# Patient Record
Sex: Male | Born: 1965 | Race: White | Hispanic: No | Marital: Married | Smoking: Former smoker
Health system: Southern US, Community
[De-identification: ages and names within clinical notes are randomized; demographics above are authoritative.]

## PROBLEM LIST (undated history)

## (undated) DIAGNOSIS — I10 Essential (primary) hypertension: Secondary | ICD-10-CM

## (undated) DIAGNOSIS — E669 Obesity, unspecified: Secondary | ICD-10-CM

## (undated) DIAGNOSIS — E785 Hyperlipidemia, unspecified: Secondary | ICD-10-CM

## (undated) DIAGNOSIS — E1169 Type 2 diabetes mellitus with other specified complication: Secondary | ICD-10-CM

## (undated) DIAGNOSIS — G4700 Insomnia, unspecified: Secondary | ICD-10-CM

## (undated) DIAGNOSIS — E1159 Type 2 diabetes mellitus with other circulatory complications: Secondary | ICD-10-CM

## (undated) DIAGNOSIS — J302 Other seasonal allergic rhinitis: Secondary | ICD-10-CM

## (undated) DIAGNOSIS — N529 Male erectile dysfunction, unspecified: Secondary | ICD-10-CM

## (undated) DIAGNOSIS — E119 Type 2 diabetes mellitus without complications: Secondary | ICD-10-CM

## (undated) HISTORY — DX: Obesity, unspecified: E66.9

## (undated) HISTORY — DX: Male erectile dysfunction, unspecified: N52.9

## (undated) HISTORY — DX: Hyperlipidemia, unspecified: E78.5

## (undated) HISTORY — DX: Essential (primary) hypertension: I10

## (undated) HISTORY — DX: Type 2 diabetes mellitus without complications: E11.9

## (undated) HISTORY — PX: OTHER SURGICAL HISTORY: SHX169

## (undated) HISTORY — DX: Type 2 diabetes mellitus with other circulatory complications: E11.69

## (undated) HISTORY — DX: Type 2 diabetes mellitus with other circulatory complications: E11.59

---

## 2006-07-19 ENCOUNTER — Emergency Department (HOSPITAL_COMMUNITY): Admission: EM | Admit: 2006-07-19 | Discharge: 2006-07-20 | Payer: Self-pay | Admitting: Emergency Medicine

## 2013-08-06 ENCOUNTER — Emergency Department (HOSPITAL_COMMUNITY)
Admission: EM | Admit: 2013-08-06 | Discharge: 2013-08-06 | Disposition: A | Discharge status: Home / Self Care | Payer: BC Managed Care – PPO | Attending: Emergency Medicine | Admitting: Emergency Medicine

## 2013-08-06 ENCOUNTER — Encounter (HOSPITAL_COMMUNITY): Payer: Self-pay | Admitting: Emergency Medicine

## 2013-08-06 ENCOUNTER — Emergency Department (HOSPITAL_COMMUNITY): Payer: BC Managed Care – PPO

## 2013-08-06 DIAGNOSIS — M545 Low back pain, unspecified: Secondary | ICD-10-CM | POA: Insufficient documentation

## 2013-08-06 DIAGNOSIS — M549 Dorsalgia, unspecified: Secondary | ICD-10-CM

## 2013-08-06 DIAGNOSIS — R52 Pain, unspecified: Secondary | ICD-10-CM | POA: Insufficient documentation

## 2013-08-06 MED ORDER — IBUPROFEN 800 MG PO TABS: 800.0000 mg | ORAL_TABLET | Freq: Three times a day (TID) | ORAL | Status: DC

## 2013-08-06 MED ORDER — ONDANSETRON HCL 4 MG/2ML IJ SOLN
4.0000 mg | Freq: Once | INTRAMUSCULAR | Status: AC
  Administered 2013-08-06: 4 mg via INTRAMUSCULAR
  Filled 2013-08-06: qty 2

## 2013-08-06 MED ORDER — HYDROCODONE-ACETAMINOPHEN 5-325 MG PO TABS: 2.0000 | ORAL_TABLET | ORAL | Status: DC | PRN

## 2013-08-06 MED ORDER — KETOROLAC TROMETHAMINE 60 MG/2ML IM SOLN
60.0000 mg | Freq: Once | INTRAMUSCULAR | Status: AC
  Administered 2013-08-06: 60 mg via INTRAMUSCULAR
  Filled 2013-08-06: qty 2

## 2013-08-06 MED ORDER — HYDROMORPHONE HCL PF 2 MG/ML IJ SOLN
2.0000 mg | Freq: Once | INTRAMUSCULAR | Status: AC
  Administered 2013-08-06: 2 mg via INTRAMUSCULAR
  Filled 2013-08-06: qty 1

## 2013-08-06 NOTE — ED Provider Notes (Signed)
CSN: [id number]     Arrival date & time 08/06/13  9562 History     None    Chief Complaint  Patient presents with  . Back Pain   (Consider location/radiation/quality/duration/timing/severity/associated sxs/prior Treatment) Patient is a 47 y.o. male presenting with back pain. The history is provided by the patient. No language interpreter was used.  Back Pain Location:  Lumbar spine Quality:  Aching Pain severity:  Severe Pain is:  Worse during the day Onset quality:  Sudden Duration:  1 day Timing:  Constant Progression:  Worsening Chronicity:  New Relieved by:  Nothing Worsened by:  Nothing tried Pt was attempting to restrain son who is MR and violent.   Pt reports area of pain is low back and radiates around to side of abdomen  History reviewed. No pertinent past medical history. History reviewed. No pertinent past surgical history. History reviewed. No pertinent family history. History  Substance Use Topics  . Smoking status: Never Smoker   . Smokeless tobacco: Not on file  . Alcohol Use: No    Review of Systems  Musculoskeletal: Positive for back pain.  All other systems reviewed and are negative.    Allergies  Review of patient's allergies indicates no known allergies.  Home Medications   Current Outpatient Rx  Name  Route  Sig  Dispense  Refill  . cetirizine (ZYRTEC) 10 MG tablet   Oral   Take 10 mg by mouth daily as needed for allergies.         . Liniments (DEEP BLUE RELIEF EX)   Apply externally   Apply 1 application topically 2 (two) times daily as needed (Biofreeze).         . traZODone (DESYREL) 150 MG tablet   Oral   Take 150 mg by mouth at bedtime.         . cyclobenzaprine (FLEXERIL) 10 MG tablet   Oral   Take 10 mg by mouth once.         Marland Kitchen ibuprofen (ADVIL,MOTRIN) 800 MG tablet   Oral   Take 800 mg by mouth once.          BP 150/90  Pulse 98  Temp(Src) 97.9 F (36.6 C) (Oral)  Resp 20  SpO2 94% Physical Exam   Nursing note and vitals reviewed. Constitutional: He appears well-developed and well-nourished.  HENT:  Head: Normocephalic and atraumatic.  Eyes: Conjunctivae and EOM are normal. Pupils are equal, round, and reactive to light.  Neck: Normal range of motion. Neck supple.  Cardiovascular: Normal rate and regular rhythm.   Pulmonary/Chest: Effort normal and breath sounds normal.  Abdominal: Soft.  Musculoskeletal:  Tender right lumbar spine and lateral to Lumbar    ED Course   Procedures (including critical care time)  Labs Reviewed - No data to display No results found. 1. Back pain, acute     MDM  No results found for this or any previous visit. Dg Lumbar Spine Complete  08/06/2013   *RADIOLOGY REPORT*  Clinical Data: Back injury yesterday, mid to lower back pain particularly on the right side  LUMBAR SPINE - COMPLETE 4+ VIEW  Comparison: None.  Findings: Normal alignment.  No fracture.  Mild degenerative disc disease at L4-5 and L3-4.  Moderate L2-3 and L1-2 degenerative disc disease.  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  IMPRESSION: Degenerative changes.  No acute abnormalities.   Original Report Authenticated By: Esperanza Heir, M.D.  Pt given  medication for pain.  I advised ice or hear   Elson Areas, PA-C 08/06/13 1054  Lonia Skinner [place], [place] 08/06/13 1057

## 2013-08-06 NOTE — ED Notes (Signed)
Patient presents to ED via POV after wrestling with son last night. Patient reports right lower back pain around to abdomen.

## 2013-08-06 NOTE — ED Provider Notes (Signed)
Medical screening examination/treatment/procedure(s) were performed by non-physician practitioner and as supervising physician I was immediately available for consultation/collaboration.   Loren Racer, MD 08/06/13 1145

## 2015-03-08 ENCOUNTER — Emergency Department
Admission: EM | Admit: 2015-03-08 | Discharge: 2015-03-08 | Disposition: A | Discharge status: Home / Self Care | Payer: BLUE CROSS/BLUE SHIELD | Source: Home / Self Care | Attending: Emergency Medicine | Admitting: Emergency Medicine

## 2015-03-08 ENCOUNTER — Emergency Department (INDEPENDENT_AMBULATORY_CARE_PROVIDER_SITE_OTHER): Payer: BLUE CROSS/BLUE SHIELD

## 2015-03-08 ENCOUNTER — Encounter: Payer: Self-pay | Admitting: Emergency Medicine

## 2015-03-08 DIAGNOSIS — R05 Cough: Secondary | ICD-10-CM | POA: Diagnosis not present

## 2015-03-08 DIAGNOSIS — R059 Cough, unspecified: Secondary | ICD-10-CM

## 2015-03-08 HISTORY — DX: Other seasonal allergic rhinitis: J30.2

## 2015-03-08 HISTORY — DX: Insomnia, unspecified: G47.00

## 2015-03-08 MED ORDER — IPRATROPIUM-ALBUTEROL 0.5-2.5 (3) MG/3ML IN SOLN: 3.0000 mL | Freq: Once | RESPIRATORY_TRACT | Status: DC

## 2015-03-08 MED ORDER — ALBUTEROL SULFATE HFA 108 (90 BASE) MCG/ACT IN AERS: 1.0000 | INHALATION_SPRAY | Freq: Four times a day (QID) | RESPIRATORY_TRACT | Status: DC | PRN

## 2015-03-08 MED ORDER — PREDNISONE 10 MG PO TABS: ORAL_TABLET | ORAL | Status: DC

## 2015-03-08 NOTE — ED Provider Notes (Signed)
CSN: [id number]     Arrival date & time 03/08/15  1614 History   First MD Initiated Contact with Patient 03/08/15 1704     Chief Complaint  Patient presents with  . Nasal Congestion   (Consider location/radiation/quality/duration/timing/severity/associated sxs/prior Treatment) Patient is a 49 y.o. male presenting with cough. The history is provided by the patient. No language interpreter was used.  Cough Cough characteristics:  Non-productive Severity:  Moderate Onset quality:  Gradual Duration:  4 days Timing:  Constant Progression:  Worsening Chronicity:  New Smoker: no   Context: upper respiratory infection   Relieved by:  Nothing Worsened by:  Deep breathing Ineffective treatments:  None tried Associated symptoms: fever   Pt complains of difficulty breathing  Past Medical History  Diagnosis Date  . Seasonal allergies   . Insomnia    History reviewed. No pertinent past surgical history. History reviewed. No pertinent family history. History  Substance Use Topics  . Smoking status: Never Smoker   . Smokeless tobacco: Not on file  . Alcohol Use: No    Review of Systems  Constitutional: Positive for fever.  Respiratory: Positive for cough.   All other systems reviewed and are negative.   Allergies  Review of patient's allergies indicates no known allergies.  Home Medications   Prior to Admission medications   Medication Sig Start Date End Date Taking? Authorizing Provider  cetirizine (ZYRTEC) 10 MG tablet Take 10 mg by mouth daily as needed for allergies.    Historical Provider, MD  cyclobenzaprine (FLEXERIL) 10 MG tablet Take 10 mg by mouth once.    Historical Provider, MD  HYDROcodone-acetaminophen (NORCO/VICODIN) 5-325 MG per tablet Take 2 tablets by mouth every 4 (four) hours as needed for pain. 08/06/13   Elson AreasLeslie K Kirah Stice, PA-C  ibuprofen (ADVIL,MOTRIN) 800 MG tablet Take 800 mg by mouth once.    Historical Provider, MD  ibuprofen (ADVIL,MOTRIN) 800 MG tablet  Take 1 tablet (800 mg total) by mouth 3 (three) times daily. 08/06/13   Elson AreasLeslie K Adelei Scobey, PA-C  Liniments (DEEP BLUE RELIEF EX) Apply 1 application topically 2 (two) times daily as needed (Biofreeze).    Historical Provider, MD  traZODone (DESYREL) 150 MG tablet Take 150 mg by mouth at bedtime.    Historical Provider, MD   BP 148/82 mmHg  Pulse 100  Temp(Src) 99.2 F (37.3 C) (Oral)  Resp 16  Ht 5\' 11"  (1.803 m)  Wt 270 lb (122.471 kg)  BMI 37.67 kg/m2  SpO2 96% Physical Exam  Constitutional: He is oriented to person, place, and time. He appears well-developed and well-nourished.  HENT:  Head: Normocephalic.  Eyes: Conjunctivae and EOM are normal. Pupils are equal, round, and reactive to light.  Neck: Normal range of motion.  Cardiovascular: Normal rate and normal heart sounds.   Pulmonary/Chest: He has wheezes.  Rhonchi,  Faint wheezing  Abdominal: Soft. He exhibits no distension.  Musculoskeletal: Normal range of motion.  Neurological: He is alert and oriented to person, place, and time.  Skin: Skin is warm.  Psychiatric: He has a normal mood and affect.  Nursing note and vitals reviewed.   ED Course  Procedures (including critical care time) Labs Review Labs Reviewed - No data to display  Imaging Review Dg Chest 2 View  03/08/2015   CLINICAL DATA:  Several week history of cough. Four day history of nasal congestion  EXAM: CHEST  2 VIEW  COMPARISON:  None.  FINDINGS: There is no edema or consolidation. The heart size and  pulmonary vascularity are normal. No adenopathy. No bone lesions.  IMPRESSION: No edema or consolidation.   Electronically Signed   By: Bretta Bang III M.D.   On: 03/08/2015 17:25     MDM  Pt given duoneb and feels much better.   Chest xray no pneumonia.   Pt given rx for prednisone and albuterol.    1. Cough    AVS    Elson Areas, PA-C 03/08/15 1750

## 2015-03-08 NOTE — ED Notes (Signed)
Reports 4 day history of congestion; thought it might be seasonal allergies, but his Zyrtec has not been effective.

## 2015-03-08 NOTE — Discharge Instructions (Signed)
Fever, Adult °A fever is a higher than normal body temperature. In an adult, an oral temperature around 98.6° F (37° C) is considered normal. A temperature of 100.4° F (38° C) or higher is generally considered a fever. Mild or moderate fevers generally have no long-term effects and often do not require treatment. Extreme fever (greater than or equal to 106° F or 41.1° C) can cause seizures. The sweating that may occur with repeated or prolonged fever may cause dehydration. Elderly people can develop confusion during a fever. °A measured temperature can vary with: °· Age. °· Time of day. °· Method of measurement (mouth, underarm, rectal, or ear). °The fever is confirmed by taking a temperature with a thermometer. Temperatures can be taken different ways. Some methods are accurate and some are not. °· An oral temperature is used most commonly. Electronic thermometers are fast and accurate. °· An ear temperature will only be accurate if the thermometer is positioned as recommended by the manufacturer. °· A rectal temperature is accurate and done for those adults who have a condition where an oral temperature cannot be taken. °· An underarm (axillary) temperature is not accurate and not recommended. °Fever is a symptom, not a disease.  °CAUSES  °· Infections commonly cause fever. °· Some noninfectious causes for fever include: °¨ Some arthritis conditions. °¨ Some thyroid or adrenal gland conditions. °¨ Some immune system conditions. °¨ Some types of cancer. °¨ A medicine reaction. °¨ High doses of certain street drugs such as methamphetamine. °¨ Dehydration. °¨ Exposure to high outside or room temperatures. °· Occasionally, the source of a fever cannot be determined. This is sometimes called a "fever of unknown origin" (FUO). °· Some situations may lead to a temporary rise in body temperature that may go away on its own. Examples are: °¨ Childbirth. °¨ Surgery. °¨ Intense exercise. °HOME CARE INSTRUCTIONS  °· Take  appropriate medicines for fever. Follow dosing instructions carefully. If you use acetaminophen to reduce the fever, be careful to avoid taking other medicines that also contain acetaminophen. Do not take aspirin for a fever if you are younger than age 19. There is an association with Reye's syndrome. Reye's syndrome is a rare but potentially deadly disease. °· If an infection is present and antibiotics have been prescribed, take them as directed. Finish them even if you start to feel better. °· Rest as needed. °· Maintain an adequate fluid intake. To prevent dehydration during an illness with prolonged or recurrent fever, you may need to drink extra fluid. Drink enough fluids to keep your urine clear or pale yellow. °· Sponging or bathing with room temperature water may help reduce body temperature. Do not use ice water or alcohol sponge baths. °· Dress comfortably, but do not over-bundle. °SEEK MEDICAL CARE IF:  °· You are unable to keep fluids down. °· You develop vomiting or diarrhea. °· You are not feeling at least partly better after 3 days. °· You develop new symptoms or problems. °SEEK IMMEDIATE MEDICAL CARE IF:  °· You have shortness of breath or trouble breathing. °· You develop excessive weakness. °· You are dizzy or you faint. °· You are extremely thirsty or you are making little or no urine. °· You develop new pain that was not there before (such as in the head, neck, chest, back, or abdomen). °· You have persistent vomiting and diarrhea for more than 1 to 2 days. °· You develop a stiff neck or your eyes become sensitive to light. °· You develop a   skin rash. °· You have a fever or persistent symptoms for more than 2 to 3 days. °· You have a fever and your symptoms suddenly get worse. °MAKE SURE YOU:  °· Understand these instructions. °· Will watch your condition. °· Will get help right away if you are not doing well or get worse. °Document Released: 06/02/2001 Document Revised: 04/23/2014 Document  Reviewed: 10/08/2011 °[employer]® Patient Information ©2015 [employer]. This information is not intended to replace advice given to you by your health care provider. Make sure you discuss any questions you have with your health care provider. °Acute Bronchitis °Bronchitis is inflammation of the airways that extend from the windpipe into the lungs (bronchi). The inflammation often causes mucus to develop. This leads to a cough, which is the most common symptom of bronchitis.  °In acute bronchitis, the condition usually develops suddenly and goes away over time, usually in a couple weeks. Smoking, allergies, and asthma can make bronchitis worse. Repeated episodes of bronchitis may cause further lung problems.  °CAUSES °Acute bronchitis is most often caused by the same virus that causes a cold. The virus can spread from person to person (contagious) through coughing, sneezing, and touching contaminated objects. °SIGNS AND SYMPTOMS  °· Cough.   °· Fever.   °· Coughing up mucus.   °· Body aches.   °· Chest congestion.   °· Chills.   °· Shortness of breath.   °· Sore throat.   °DIAGNOSIS  °Acute bronchitis is usually diagnosed through a physical exam. Your health care provider will also ask you questions about your medical history. Tests, such as chest X-rays, are sometimes done to rule out other conditions.  °TREATMENT  °Acute bronchitis usually goes away in a couple weeks. Oftentimes, no medical treatment is necessary. Medicines are sometimes given for relief of fever or cough. Antibiotic medicines are usually not needed but may be prescribed in certain situations. In some cases, an inhaler may be recommended to help reduce shortness of breath and control the cough. A cool mist vaporizer may also be used to help thin bronchial secretions and make it easier to clear the chest.  °HOME CARE INSTRUCTIONS °· Get plenty of rest.   °· Drink enough fluids to keep your urine clear or pale yellow (unless you have a medical  condition that requires fluid restriction). Increasing fluids may help thin your respiratory secretions (sputum) and reduce chest congestion, and it will prevent dehydration.   °· Take medicines only as directed by your health care provider. °· If you were prescribed an antibiotic medicine, finish it all even if you start to feel better. °· Avoid smoking and secondhand smoke. Exposure to cigarette smoke or irritating chemicals will make bronchitis worse. If you are a smoker, consider using nicotine gum or skin patches to help control withdrawal symptoms. Quitting smoking will help your lungs heal faster.   °· Reduce the chances of another bout of acute bronchitis by washing your hands frequently, avoiding people with cold symptoms, and trying not to touch your hands to your mouth, nose, or eyes.   °· Keep all follow-up visits as directed by your health care provider.   °SEEK MEDICAL CARE IF: °Your symptoms do not improve after 1 week of treatment.  °SEEK IMMEDIATE MEDICAL CARE IF: °· You develop an increased fever or chills.   °· You have chest pain.   °· You have severe shortness of breath. °· You have bloody sputum.   °· You develop dehydration. °· You faint or repeatedly feel like you are going to pass out. °· You develop repeated vomiting. °·   You develop a severe headache. °MAKE SURE YOU:  °· Understand these instructions. °· Will watch your condition. °· Will get help right away if you are not doing well or get worse. °Document Released: 01/14/2005 Document Revised: 04/23/2014 Document Reviewed: 05/30/2013 °[employer]® Patient Information ©2015 [employer]. This information is not intended to replace advice given to you by your health care provider. Make sure you discuss any questions you have with your health care provider. ° °

## 2016-06-25 DIAGNOSIS — E119 Type 2 diabetes mellitus without complications: Secondary | ICD-10-CM | POA: Diagnosis not present

## 2016-09-01 ENCOUNTER — Emergency Department (HOSPITAL_BASED_OUTPATIENT_CLINIC_OR_DEPARTMENT_OTHER): Payer: BLUE CROSS/BLUE SHIELD

## 2016-09-01 ENCOUNTER — Emergency Department (HOSPITAL_BASED_OUTPATIENT_CLINIC_OR_DEPARTMENT_OTHER)
Admission: EM | Admit: 2016-09-01 | Discharge: 2016-09-02 | Disposition: A | Discharge status: Home / Self Care | Payer: BLUE CROSS/BLUE SHIELD | Attending: Emergency Medicine | Admitting: Emergency Medicine

## 2016-09-01 ENCOUNTER — Encounter (HOSPITAL_BASED_OUTPATIENT_CLINIC_OR_DEPARTMENT_OTHER): Payer: Self-pay | Admitting: Emergency Medicine

## 2016-09-01 DIAGNOSIS — Y939 Activity, unspecified: Secondary | ICD-10-CM | POA: Diagnosis not present

## 2016-09-01 DIAGNOSIS — S20211A Contusion of right front wall of thorax, initial encounter: Secondary | ICD-10-CM | POA: Diagnosis not present

## 2016-09-01 DIAGNOSIS — S39012A Strain of muscle, fascia and tendon of lower back, initial encounter: Secondary | ICD-10-CM

## 2016-09-01 DIAGNOSIS — S161XXA Strain of muscle, fascia and tendon at neck level, initial encounter: Secondary | ICD-10-CM | POA: Diagnosis not present

## 2016-09-01 DIAGNOSIS — M542 Cervicalgia: Secondary | ICD-10-CM | POA: Diagnosis not present

## 2016-09-01 DIAGNOSIS — Y92410 Unspecified street and highway as the place of occurrence of the external cause: Secondary | ICD-10-CM | POA: Diagnosis not present

## 2016-09-01 DIAGNOSIS — S3992XA Unspecified injury of lower back, initial encounter: Secondary | ICD-10-CM | POA: Diagnosis not present

## 2016-09-01 DIAGNOSIS — Y999 Unspecified external cause status: Secondary | ICD-10-CM | POA: Diagnosis not present

## 2016-09-01 DIAGNOSIS — M545 Low back pain: Secondary | ICD-10-CM | POA: Diagnosis not present

## 2016-09-01 DIAGNOSIS — M546 Pain in thoracic spine: Secondary | ICD-10-CM

## 2016-09-01 DIAGNOSIS — S199XXA Unspecified injury of neck, initial encounter: Secondary | ICD-10-CM | POA: Diagnosis not present

## 2016-09-01 DIAGNOSIS — S299XXA Unspecified injury of thorax, initial encounter: Secondary | ICD-10-CM | POA: Diagnosis not present

## 2016-09-01 MED ORDER — MELOXICAM 15 MG PO TABS: 15.0000 mg | ORAL_TABLET | Freq: Every day | ORAL | 0 refills | Status: DC

## 2016-09-01 MED ORDER — METHOCARBAMOL 500 MG PO TABS
750.0000 mg | ORAL_TABLET | Freq: Once | ORAL | Status: AC
  Administered 2016-09-02: 750 mg via ORAL
  Filled 2016-09-01: qty 2

## 2016-09-01 MED ORDER — METHOCARBAMOL 750 MG PO TABS: 750.0000 mg | ORAL_TABLET | Freq: Two times a day (BID) | ORAL | 0 refills | Status: DC

## 2016-09-01 MED ORDER — KETOROLAC TROMETHAMINE 60 MG/2ML IM SOLN
60.0000 mg | Freq: Once | INTRAMUSCULAR | Status: AC
  Administered 2016-09-02: 60 mg via INTRAMUSCULAR
  Filled 2016-09-01: qty 2
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
## 2016-09-01 NOTE — ED Notes (Signed)
PA at bedside.

## 2016-09-01 NOTE — ED Notes (Signed)
Patient transported to X-ray 

## 2016-09-01 NOTE — Discharge Instructions (Signed)
Take Mobic as prescribed for pain and inflammation. Robaxin for muscle spasms. Rest. Follow with primary care doctor if not improving. Return if worsening.
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
## 2016-09-01 NOTE — ED Notes (Signed)
MD at bedside to do bedside US.

## 2016-09-02 DIAGNOSIS — S161XXA Strain of muscle, fascia and tendon at neck level, initial encounter: Secondary | ICD-10-CM | POA: Diagnosis not present

## 2016-10-09 DIAGNOSIS — Z23 Encounter for immunization: Secondary | ICD-10-CM | POA: Diagnosis not present

## 2017-01-05 DIAGNOSIS — E119 Type 2 diabetes mellitus without complications: Secondary | ICD-10-CM | POA: Diagnosis not present

## 2017-02-01 DIAGNOSIS — R070 Pain in throat: Secondary | ICD-10-CM | POA: Diagnosis not present

## 2017-02-01 DIAGNOSIS — E119 Type 2 diabetes mellitus without complications: Secondary | ICD-10-CM | POA: Diagnosis not present

## 2017-08-30 DIAGNOSIS — J0110 Acute frontal sinusitis, unspecified: Secondary | ICD-10-CM | POA: Diagnosis not present

## 2017-08-30 DIAGNOSIS — G4700 Insomnia, unspecified: Secondary | ICD-10-CM | POA: Diagnosis not present

## 2017-08-30 DIAGNOSIS — Z8249 Family history of ischemic heart disease and other diseases of the circulatory system: Secondary | ICD-10-CM | POA: Diagnosis not present

## 2017-09-29 DIAGNOSIS — E669 Obesity, unspecified: Secondary | ICD-10-CM | POA: Diagnosis not present

## 2017-09-29 DIAGNOSIS — Z0000 Encounter for general adult medical examination without abnormal findings: Secondary | ICD-10-CM | POA: Diagnosis not present

## 2017-09-29 DIAGNOSIS — Z23 Encounter for immunization: Secondary | ICD-10-CM | POA: Diagnosis not present

## 2017-09-29 DIAGNOSIS — E7800 Pure hypercholesterolemia, unspecified: Secondary | ICD-10-CM | POA: Diagnosis not present

## 2017-09-29 DIAGNOSIS — Z125 Encounter for screening for malignant neoplasm of prostate: Secondary | ICD-10-CM | POA: Diagnosis not present

## 2017-09-29 DIAGNOSIS — E119 Type 2 diabetes mellitus without complications: Secondary | ICD-10-CM | POA: Diagnosis not present

## 2018-08-01 DIAGNOSIS — I10 Essential (primary) hypertension: Secondary | ICD-10-CM | POA: Diagnosis not present

## 2018-08-01 DIAGNOSIS — E119 Type 2 diabetes mellitus without complications: Secondary | ICD-10-CM | POA: Diagnosis not present

## 2018-08-01 DIAGNOSIS — R0609 Other forms of dyspnea: Secondary | ICD-10-CM | POA: Diagnosis not present

## 2018-08-01 DIAGNOSIS — F17290 Nicotine dependence, other tobacco product, uncomplicated: Secondary | ICD-10-CM | POA: Diagnosis not present
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
## 2018-10-10 DIAGNOSIS — E1169 Type 2 diabetes mellitus with other specified complication: Secondary | ICD-10-CM | POA: Diagnosis not present

## 2018-10-10 DIAGNOSIS — N528 Other male erectile dysfunction: Secondary | ICD-10-CM | POA: Diagnosis not present

## 2018-10-10 DIAGNOSIS — G4709 Other insomnia: Secondary | ICD-10-CM | POA: Diagnosis not present

## 2018-10-10 DIAGNOSIS — I10 Essential (primary) hypertension: Secondary | ICD-10-CM | POA: Diagnosis not present

## 2018-10-10 DIAGNOSIS — Z23 Encounter for immunization: Secondary | ICD-10-CM | POA: Diagnosis not present

## 2018-10-14 DIAGNOSIS — E785 Hyperlipidemia, unspecified: Secondary | ICD-10-CM | POA: Diagnosis not present

## 2018-10-14 DIAGNOSIS — E119 Type 2 diabetes mellitus without complications: Secondary | ICD-10-CM | POA: Diagnosis not present

## 2018-10-14 DIAGNOSIS — N529 Male erectile dysfunction, unspecified: Secondary | ICD-10-CM | POA: Diagnosis not present

## 2018-10-14 DIAGNOSIS — I10 Essential (primary) hypertension: Secondary | ICD-10-CM | POA: Diagnosis not present

## 2019-01-19 DIAGNOSIS — E1169 Type 2 diabetes mellitus with other specified complication: Secondary | ICD-10-CM | POA: Diagnosis not present

## 2019-01-19 DIAGNOSIS — Z125 Encounter for screening for malignant neoplasm of prostate: Secondary | ICD-10-CM | POA: Diagnosis not present

## 2019-01-19 DIAGNOSIS — R82998 Other abnormal findings in urine: Secondary | ICD-10-CM | POA: Diagnosis not present

## 2019-01-19 DIAGNOSIS — I10 Essential (primary) hypertension: Secondary | ICD-10-CM | POA: Diagnosis not present

## 2019-01-19 DIAGNOSIS — Z0000 Encounter for general adult medical examination without abnormal findings: Secondary | ICD-10-CM | POA: Diagnosis not present

## 2019-01-25 DIAGNOSIS — E1169 Type 2 diabetes mellitus with other specified complication: Secondary | ICD-10-CM | POA: Diagnosis not present

## 2019-01-25 DIAGNOSIS — G4709 Other insomnia: Secondary | ICD-10-CM | POA: Diagnosis not present

## 2019-01-25 DIAGNOSIS — I10 Essential (primary) hypertension: Secondary | ICD-10-CM | POA: Diagnosis not present

## 2019-01-25 DIAGNOSIS — E7849 Other hyperlipidemia: Secondary | ICD-10-CM | POA: Diagnosis not present

## 2019-01-25 DIAGNOSIS — Z0000 Encounter for general adult medical examination without abnormal findings: Secondary | ICD-10-CM | POA: Diagnosis not present

## 2019-02-13 ENCOUNTER — Ambulatory Visit (INDEPENDENT_AMBULATORY_CARE_PROVIDER_SITE_OTHER): Payer: BLUE CROSS/BLUE SHIELD | Admitting: Cardiology

## 2019-02-13 ENCOUNTER — Encounter: Payer: Self-pay | Admitting: Cardiology

## 2019-02-13 VITALS — BP 154/102 | HR 74 | Ht 71.0 in | Wt 269.0 lb

## 2019-02-13 DIAGNOSIS — E8881 Metabolic syndrome: Secondary | ICD-10-CM

## 2019-02-13 DIAGNOSIS — Z8249 Family history of ischemic heart disease and other diseases of the circulatory system: Secondary | ICD-10-CM

## 2019-02-13 DIAGNOSIS — I10 Essential (primary) hypertension: Secondary | ICD-10-CM

## 2019-02-13 DIAGNOSIS — Z01818 Encounter for other preprocedural examination: Secondary | ICD-10-CM

## 2019-02-13 DIAGNOSIS — E119 Type 2 diabetes mellitus without complications: Secondary | ICD-10-CM

## 2019-02-13 DIAGNOSIS — E785 Hyperlipidemia, unspecified: Secondary | ICD-10-CM

## 2019-02-13 DIAGNOSIS — R0609 Other forms of dyspnea: Secondary | ICD-10-CM | POA: Diagnosis not present

## 2019-02-13 DIAGNOSIS — E1169 Type 2 diabetes mellitus with other specified complication: Secondary | ICD-10-CM | POA: Insufficient documentation

## 2019-02-13 DIAGNOSIS — R0600 Dyspnea, unspecified: Secondary | ICD-10-CM

## 2019-02-13 MED ORDER — METOPROLOL TARTRATE 50 MG PO TABS: 100.0000 mg | ORAL_TABLET | Freq: Once | ORAL | 0 refills | Status: AC

## 2019-02-13 NOTE — Patient Instructions (Addendum)
Medication Instructions:  See instructions   CCTA  If you need a refill on your cardiac medications before your next appointment, please call your pharmacy.   Lab work: SEE INSTRUCTION CCTA If you have labs (blood work) drawn today and your tests are completely normal, you will receive your results only by: Marland Kitchen MyChart Message (if you have MyChart) OR . A paper copy in the mail If you have any lab test that is abnormal or we need to change your treatment, we will call you to review the results.  Testing/Procedures: WILL BE SCHEDULE AFTER  AUTHORIZATION IS OBTAINED FROM YOUR INSURANCE [facility] RADIOLOGY DEPARTMENT Your physician has requested that you have Coronary cardiac CTA. Cardiac computed tomography (CT) is a painless test that uses an x-ray machine to take clear, detailed pictures of your heart. For further information please visit [email or web address] Please follow instruction sheet as given.     Follow-Up: At [facility], you and your health needs are our priority.  As part of our continuing mission to provide you with exceptional heart care, we have created designated Provider Care Teams.  These Care Teams include your primary Cardiologist (physician) and Advanced Practice Providers (APPs -  Physician Assistants and Nurse Practitioners) who all work together to provide you with the care you need, when you need it. . Your physician recommends that you schedule a follow-up appointment in 2 MONTHS WITH DR HARDING .   Any Other Special Instructions Will Be Listed Below (If Applicable).   Please arrive at the [facility] main entrance of [facility].(30-45 minutes prior to test start time)  [facility] [address] [place], [place] [address] [phone]  Proceed to the [facility] Radiology Department (First Floor).  Please follow these instructions carefully (unless otherwise directed):  PLEASE HAVE LAB WORK DONE ONE WEEK PRIOR TO TEST.  On  the Night Before the Test: . Be sure to Drink plenty of water. . Do not consume any caffeinated/decaffeinated beverages or chocolate 12 hours prior to your test. . Do not take any antihistamines 12 hours prior to your test. . If you take Metformin do not take 24 hours prior to test.  On the Day of the Test: . Drink plenty of water. Do not drink any water within one hour of the test. . Do not eat any food 4 hours prior to the test. . You may take your regular medications prior to the test.  . Take metoprolol (Lopressor)  100 MG  two hours prior to test.   After the Test: . Drink plenty of water. . After receiving IV contrast, you may experience a mild flushed feeling. This is normal. . On occasion, you may experience a mild rash up to 24 hours after the test. This is not dangerous. If this occurs, you can take Benadryl 25 mg and increase your fluid intake. . If you experience trouble breathing, this can be serious. If it is severe call 911 IMMEDIATELY. If it is mild, please call our office. . If you take any of these medications: Glipizide/Metformin, Avandament, Glucavance, please do not take 48 hours after completing test.

## 2019-02-13 NOTE — Progress Notes (Signed)
PCP: Alysia PennaHolwerda, Scott, MD  Clinic Note: Chief Complaint  Patient presents with  . New Patient (Initial Visit)    Baseline cardiac evaluation: Strong family history of premature CAD, hypertension, hyperlipidemia, DM-2, former smoker (metabolic syndrome)    HPI: Brian Odonnell is a 53 y.o. male with a PMH documented below who presents today for baseline cardiovascular evaluation at the request of Alysia PennaHolwerda, Scott, MD.  Brian Odonnell was last seen on February 6 by Dr. Link SnufferHolwerda.  Recent Hospitalizations: None  Studies Personally Reviewed - (if available, images/films reviewed: From Epic Chart or Care Everywhere)  None  Interval History: Brian Odonnell is a very pleasant gentleman with a significant family history of CAD who presents here for baseline evaluation.  He is a little bit concerned because he is starting to be much more active with walking exercises accompanying his wife during work breaks.  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  He has noted occasionally feeling short of breath doing activities that he did not easily make him feel short of breath.  He has not had any chest pain or pressure with rest or exertion.  He denies any resting dyspnea.  No PND, orthopnea.  No significant edema just may be some mild swelling in his feet after being on his feet all day long. He denies any rapid regular heartbeats palpitations.  No syncope/near syncope or TIA/amaurosis fugax. No claudication.  PAD Screen 02/13/2019  Previous PAD dx? No  Previous surgical procedure? No  Pain with walking? No  Feet/toe relief with dangling? No  Painful, non-healing ulcers? No  Extremities discolored? No    A comprehensive was performed. Review of Systems  Constitutional: Negative for malaise/fatigue and weight loss.  HENT: Negative for congestion and nosebleeds.   Respiratory: Positive for shortness of breath (Per HPI). Negative for  cough and wheezing.   Gastrointestinal: Negative for blood in stool, heartburn and melena.  Genitourinary: Negative for hematuria.  Musculoskeletal: Negative for joint pain.  Neurological: Negative for dizziness, focal weakness and weakness.  Psychiatric/Behavioral: Negative for memory loss. The patient is not nervous/anxious and does not have insomnia.   All other systems reviewed and are negative.   I have reviewed and (if needed) personally updated the patient's problem list, medications, allergies, past medical and surgical history, social and family history.   Past Medical History:  Diagnosis Date  . Diabetes mellitus without complication (HCC)    Currently on metformin  . ED (erectile dysfunction)   . Essential hypertension    On losartan  . Hyperlipidemia    Not on statin  . Insomnia   . Obesity, diabetes, and hypertension syndrome (HCC)    BMI of 37 with other risk factors = morbid obesity  . Seasonal allergies     Past Surgical History:  Procedure Laterality Date  . None      Current Meds  Medication Sig  . aspirin EC 81 MG tablet Take 81 mg by mouth daily.  Marland Kitchen. losartan (COZAAR) 100 MG tablet Take 100 mg by mouth daily.  . metformin (FORTAMET) 500 MG (OSM) 24 hr tablet Take 1,000 mg by mouth 2 (two) times daily with a meal.  . traZODone (DESYREL) 150 MG tablet Take 150 mg by mouth at bedtime.    No Known Allergies  Social History   Tobacco Use  . Smoking status: Former Smoker    Packs/day: 1.50  Years: 13.00    Pack years: 19.50    Types: Cigarettes    Last attempt to quit: 02/07/1996    Years since quitting: 23.0  . Smokeless tobacco: Never Used  Substance Use Topics  . Alcohol use: Yes    Alcohol/week: 3.0 standard drinks    Types: 3 Standard drinks or equivalent per week    Comment: socially -usually vodka or tequila  . Drug use: Never   Social History   Social History Narrative   He is a former smoker of 1 to 2 packs a day, smoked from  [phone].   He is a Production designer, theatre/television/film of a TEFL teacher.  Does have passive smoke exposure.   Less than 1 caffeine drink a day.   Walks apically 7 days a week (roughly 30 minutes) during summer months, weather dependent during colder months, but walks outside with his wife during lunch.   3 vodka or tequila drinks a week.      As of 2019, children's age 30, 59, 47 and 68.  54 year old is severely autistic    family history includes Arthritis in his maternal grandmother; Heart attack in his paternal grandfather and paternal uncle; Heart attack (age of onset: 29) in his father; Hypertension in his father; Obesity in his father.  Wt Readings from Last 3 Encounters:  02/13/19 269 lb (122 kg)  09/01/16 255 lb (115.7 kg)  03/08/15 270 lb (122.5 kg)    PHYSICAL EXAM BP (!) 154/102   Pulse 74   Ht 5\' 11"  (1.803 m)   Wt 269 lb (122 kg)   BMI 37.52 kg/m  Physical Exam  Constitutional: He is oriented to person, place, and time. He appears well-developed and well-nourished. No distress.  Moderate to morbidly obese with BMI of 37. This may not be a true accurate assessment of obesity because he is quite muscular.  HENT:  Head: Normocephalic and atraumatic.  Eyes: Pupils are equal, round, and reactive to light. Conjunctivae and EOM are normal. No scleral icterus.  Neck: Normal range of motion. Neck supple. No hepatojugular reflux and no JVD present. Carotid bruit is not present. No tracheal deviation present. No thyromegaly present.  Cardiovascular: Normal rate, regular rhythm, S1 normal, S2 normal and normal pulses.  No extrasystoles are present. PMI is not displaced. Exam reveals distant heart sounds. Exam reveals no gallop and no friction rub.  No murmur heard. Pulmonary/Chest: Effort normal and breath sounds normal. No respiratory distress. He has no wheezes. He has no rales. He exhibits no tenderness.  Abdominal: Soft. Bowel sounds are normal. He exhibits no distension. There is no abdominal  tenderness.  Unable to palpate HSM  Musculoskeletal: Normal range of motion.        General: No deformity or edema.  Lymphadenopathy:    He has no cervical adenopathy.  Neurological: He is alert and oriented to person, place, and time. No cranial nerve deficit.  Psychiatric: He has a normal mood and affect. His behavior is normal. Judgment and thought content normal.  Vitals reviewed.    Adult ECG Report  Rate: 74;  Rhythm: normal sinus rhythm and Normal axis, intervals and durations;   Narrative Interpretation: Normal EKG  Other studies Reviewed: Additional studies/ records that were reviewed today include:  Recent Labs: January 19, 2018  Na+ 139, K+ 4.2, Cl- 105, HCO3-26, BUN 12, Cr 0.9, Glu 200**, Ca2+ 9.2; AST 20, ALT 43, AlkP 61,  T Bili 1.6, TP 6.6,Alb 4.2  CBC: W 5.71, H/H 15.4/47.2, Plt  197  TC 198, TG 119, HDL 43, LDL 131; A1c 8.4; TSH 1.01   ASSESSMENT / PLAN: Problem List Items Addressed This Visit    DOE (dyspnea on exertion)    He is try to do a lot of exercise, but 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  This is little concerning for him since he is currently at the age that his father was when he first started having issues with his heart.  With multiple risk factors and significant family history, I do think that is reasonable to do a baseline evaluation. In the absence of symptoms I think we just need to do a coronary calcium score, however with exertional dyspnea as a possible anginal equivalent (in a diabetic you may not feel classic anginal symptoms), I feel it is warranted to evaluate for true ischemic CAD with anatomical evaluation.  Plan: Coronary Calcium Score with Coronary CT angiogram and possible CT FFR.      Relevant Orders   EKG 12-Lead   Basic metabolic panel   CT CORONARY MORPH W/CTA COR W/SCORE W/CA W/CM &/OR WO/CM   CT CORONARY FRACTIONAL FLOW RESERVE DATA PREP   CT CORONARY  FRACTIONAL FLOW RESERVE FLUID ANALYSIS   Essential hypertension (Chronic)    Blood pressure is high today, he is quite stressed out [place] [facility] with lots of stress about running late.  In his PCPs office, his blood pressure is well controlled.  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  We will simply monitor and follow-up. He is on high-dose losartan (appropriate for him being a diabetic).  For now would simply monitor.      Family history of premature CAD - Primary (Chronic)    His father had premature CAD in his early 8s (roughly his current age) and he did not have diabetes nor the smoking history.  He was also not as heavy. He is concerned necessarily because of his exertional dyspnea and the desire to get into an exercise program to try to lose weight and help his diabetes, hyperlipidemia and hypertension.  Plan: Baseline CARDIOVASCULAR EVALUATION WITH CORONARY CALCIUM SCORE PLUS CORONARY CT ANGIOGRAM in order to identify anatomic and physiologic evidence of CAD.      Relevant Orders   EKG 12-Lead   Basic metabolic panel   CT CORONARY MORPH W/CTA COR W/SCORE W/CA W/CM &/OR WO/CM   CT CORONARY FRACTIONAL FLOW RESERVE DATA PREP   CT CORONARY FRACTIONAL FLOW RESERVE FLUID ANALYSIS   Hyperlipidemia associated with type 2 diabetes mellitus (HCC) (Chronic)    Not currently on statin, but most recent labs showed total cholesterol of 198 and LDL of 131.  With his family history we want at least shoot for an LDL less than 100.  Depending on what our baseline cardiac evaluation shows, we can allow for 6 months of dietary modification and exercise and reassess versus being more aggressive with adding a statin if significant CAD noted on evaluation.  Plan: Continue lifestyle and dietary modification and reevaluate based on results of CORONARY CALCIUM SCORE AND CORONARY CT ANGIOGRAM.      Metabolic syndrome (Chronic)     Relevant Orders   CT CORONARY MORPH W/CTA COR W/SCORE W/CA W/CM &/OR WO/CM   CT CORONARY FRACTIONAL FLOW RESERVE DATA PREP   CT CORONARY FRACTIONAL FLOW RESERVE FLUID ANALYSIS   Morbid  obesity (HCC) (Chronic)    Talked about the importance of dietary modification, cutting down food.  He is now definitely getting into exercise regimen.  In order to make you feel comfortable with being more aggressive in his exercise, he would like to evaluate the exertional dyspnea.  Plan: Baseline cardiovascular valuation with Coronary CT Angiogram And Coronary Calcium Score      Type 2 diabetes mellitus without complication, without long-term current use of insulin (HCC) (Chronic)    Constitutes part of the metabolic syndrome.  Puts him at higher risk.  With his already significant risk of family history and smoking history, he is at higher risk and even his father was.  Currently on metformin.  Last A1c was 8.4.  Based on Coronary CT Angiogram results, may need to be more aggressive.      Relevant Orders   EKG 12-Lead   CT CORONARY MORPH W/CTA COR W/SCORE W/CA W/CM &/OR WO/CM   CT CORONARY FRACTIONAL FLOW RESERVE DATA PREP   CT CORONARY FRACTIONAL FLOW RESERVE FLUID ANALYSIS    Other Visit Diagnoses    Pre-op testing       Relevant Orders   Basic metabolic panel     I spent a total of 40-45 minutes with the patient and chart review. >  50% of the time was spent in direct patient consultation.   Current medicines are reviewed at length with the patient today.  (+/- concerns) none The following changes have been made:  None  Patient Instructions  Medication Instructions:  See instructions   CCTA  If you need a refill on your cardiac medications before your next appointment, please call your pharmacy.   Lab work: SEE INSTRUCTION CCTA If you have labs (blood work) drawn today and your tests are completely normal, you will receive your results only by: Marland Kitchen MyChart Message (if you have  MyChart) OR . A paper copy in the mail If you have any lab test that is abnormal or we need to change your treatment, we will call you to review the results.  Testing/Procedures: WILL BE SCHEDULE AFTER  AUTHORIZATION IS OBTAINED FROM YOUR INSURANCE [facility] RADIOLOGY DEPARTMENT Your physician has requested that you have Coronary cardiac CTA. Cardiac computed tomography (CT) is a painless test that uses an x-ray machine to take clear, detailed pictures of your heart. For further information please visit [email or web address] Please follow instruction sheet as given.     Follow-Up: At [facility], you and your health needs are our priority.  As part of our continuing mission to provide you with exceptional heart care, we have created designated Provider Care Teams.  These Care Teams include your primary Cardiologist (physician) and Advanced Practice Providers (APPs -  Physician Assistants and Nurse Practitioners) who all work together to provide you with the care you need, when you need it. . Your physician recommends that you schedule a follow-up appointment in 2 MONTHS WITH DR HARDING .   Studies Ordered:   Orders Placed This Encounter  Procedures  . CT CORONARY MORPH W/CTA COR W/SCORE W/CA W/CM &/OR WO/CM  . CT CORONARY FRACTIONAL FLOW RESERVE DATA PREP  . CT CORONARY FRACTIONAL FLOW RESERVE FLUID ANALYSIS  . Basic metabolic panel  . EKG 12-Lead      Bryan Lemma, M.D., M.S. Interventional Cardiologist   Pager # [phone] Phone # [phone] [address] [place], [place] [address]   Thank you for choosing Heartcare at [facility]!!

## 2019-02-14 ENCOUNTER — Encounter: Payer: Self-pay | Admitting: Cardiology

## 2019-02-14 NOTE — Assessment & Plan Note (Signed)
Constitutes part of the metabolic syndrome.  Puts him at higher risk.  With his already significant risk of family history and smoking history, he is at higher risk and even his father was.  Currently on metformin.  Last A1c was 8.4.  Based on Coronary CT Angiogram results, may need to be more aggressive.

## 2019-02-14 NOTE — Assessment & Plan Note (Signed)
His father had premature CAD in his early 50s (roughly his current age) and he did not have diabetes nor the smoking history.  He was also not as heavy. He is concerned necessarily because of his exertional dyspnea and the desire to get into an exercise program to try to lose weight and help his diabetes, hyperlipidemia and hypertension.  Plan: Baseline CARDIOVASCULAR EVALUATION WITH CORONARY CALCIUM SCORE PLUS CORONARY CT ANGIOGRAM in order to identify anatomic and physiologic evidence of CAD.

## 2019-02-14 NOTE — Assessment & Plan Note (Signed)
Not currently on statin, but most recent labs showed total cholesterol of 198 and LDL of 131.  With his family history we want at least shoot for an LDL less than 100.  Depending on what our baseline cardiac evaluation shows, we can allow for 6 months of dietary modification and exercise and reassess versus being more aggressive with adding a statin if significant CAD noted on evaluation.  Plan: Continue lifestyle and dietary modification and reevaluate based on results of CORONARY CALCIUM SCORE AND CORONARY CT ANGIOGRAM.

## 2019-02-14 NOTE — Assessment & Plan Note (Signed)
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  This is little concerning for him since he is currently at the age that his father was when he first started having issues with his heart.  With multiple risk factors and significant family history, I do think that is reasonable to do a baseline evaluation. In the absence of symptoms I think we just need to do a coronary calcium score, however with exertional dyspnea as a possible anginal equivalent (in a diabetic you may not feel classic anginal symptoms), I feel it is warranted to evaluate for true ischemic CAD with anatomical evaluation.  Plan: Coronary Calcium Score with Coronary CT angiogram and possible CT FFR.

## 2019-02-14 NOTE — Assessment & Plan Note (Signed)
Blood pressure is high today, he is quite stressed out having driven all the way back [facility] today with lots of stress about running late.  In his PCPs office, his blood pressure is well controlled.  I suspect that this is partly related to Peden coat syndrome and being stressed.  We will simply monitor and follow-up. He is on high-dose losartan (appropriate for him being a diabetic).  For now would simply monitor.

## 2019-02-14 NOTE — Assessment & Plan Note (Signed)
Talked about the importance of dietary modification, cutting down food.  He is now definitely getting into exercise regimen.  In order to make you feel comfortable with being more aggressive in his exercise, he would like to evaluate the exertional dyspnea.  Plan: Baseline cardiovascular valuation with Coronary CT Angiogram And Coronary Calcium Score

## 2019-04-13 ENCOUNTER — Telehealth: Payer: Self-pay

## 2019-04-13 NOTE — Telephone Encounter (Signed)
Left message on patient voicemail to call the office to reschedule appointment for September Due to covid 19. Per/ Dr. Herbie Baltimore

## 2019-04-17 ENCOUNTER — Ambulatory Visit: Payer: BLUE CROSS/BLUE SHIELD | Admitting: Cardiology

## 2019-04-24 DIAGNOSIS — E1169 Type 2 diabetes mellitus with other specified complication: Secondary | ICD-10-CM | POA: Diagnosis not present

## 2019-04-25 DIAGNOSIS — E1169 Type 2 diabetes mellitus with other specified complication: Secondary | ICD-10-CM | POA: Diagnosis not present

## 2019-05-12 ENCOUNTER — Encounter: Payer: Self-pay | Admitting: Gastroenterology

## 2019-05-31 ENCOUNTER — Telehealth: Payer: Self-pay

## 2019-05-31 NOTE — Telephone Encounter (Signed)
Please check if cardiology can see her prior to colonoscopy and reschedule the colonoscopy until after patient is cleared by cardiology.  Thank you

## 2019-05-31 NOTE — Telephone Encounter (Signed)
Pt wants to cancel appointment he will call back after cardiac clearance.

## 2019-05-31 NOTE — Telephone Encounter (Signed)
Pt is schedule for a PV on 06-05-19 and colon on 06-19-19. Pt was having some shortness of breath and was in the process of having a cardiac work up prior to covid. Pt had initial appointment with Dr. Jossie Ng (cardiologist) 02-13-19. Cardiologist ordered Coronary Calcium score with coronary CT  Angiogram and possible CT FFR. Pt has not had this done due to covid, note says to follow back up in Sept with test. Please advised if we can proceed with colon or does he need to wait for cardiac clearance. Sharyn Lull PV

## 2019-06-07 ENCOUNTER — Other Ambulatory Visit: Payer: Self-pay | Admitting: *Deleted

## 2019-06-07 DIAGNOSIS — Z8249 Family history of ischemic heart disease and other diseases of the circulatory system: Secondary | ICD-10-CM

## 2019-06-07 DIAGNOSIS — R0600 Dyspnea, unspecified: Secondary | ICD-10-CM

## 2019-06-07 DIAGNOSIS — E119 Type 2 diabetes mellitus without complications: Secondary | ICD-10-CM

## 2019-06-07 DIAGNOSIS — E8881 Metabolic syndrome: Secondary | ICD-10-CM

## 2019-06-07 DIAGNOSIS — R0609 Other forms of dyspnea: Secondary | ICD-10-CM

## 2019-06-07 NOTE — Progress Notes (Signed)
05/29/19 We need a new order put in for the Cardiac Ct - the main part the cardiac morph expired. If the patient still needs to have this done will you please put in a new order for the test.   Thanks  Pia Mau order placed for CCTA AS REQUESTED

## 2019-06-19 ENCOUNTER — Encounter: Payer: BLUE CROSS/BLUE SHIELD | Admitting: Gastroenterology

## 2019-07-03 ENCOUNTER — Ambulatory Visit (HOSPITAL_COMMUNITY): Payer: BC Managed Care – PPO

## 2019-07-03 ENCOUNTER — Ambulatory Visit (HOSPITAL_COMMUNITY): Admission: RE | Admit: 2019-07-03 | Payer: BC Managed Care – PPO | Source: Ambulatory Visit

## 2019-07-15 DIAGNOSIS — Z20828 Contact with and (suspected) exposure to other viral communicable diseases: Secondary | ICD-10-CM | POA: Diagnosis not present

## 2019-07-27 DIAGNOSIS — F1010 Alcohol abuse, uncomplicated: Secondary | ICD-10-CM | POA: Diagnosis not present

## 2019-07-27 DIAGNOSIS — E1169 Type 2 diabetes mellitus with other specified complication: Secondary | ICD-10-CM | POA: Diagnosis not present

## 2019-07-27 DIAGNOSIS — I25119 Atherosclerotic heart disease of native coronary artery with unspecified angina pectoris: Secondary | ICD-10-CM | POA: Diagnosis not present

## 2019-08-22 ENCOUNTER — Ambulatory Visit: Payer: BC Managed Care – PPO | Admitting: Cardiology

## 2019-08-25 ENCOUNTER — Other Ambulatory Visit (HOSPITAL_COMMUNITY): Payer: BC Managed Care – PPO

## 2019-09-19 DIAGNOSIS — Z8249 Family history of ischemic heart disease and other diseases of the circulatory system: Secondary | ICD-10-CM | POA: Diagnosis not present

## 2019-09-19 DIAGNOSIS — Z01818 Encounter for other preprocedural examination: Secondary | ICD-10-CM | POA: Diagnosis not present

## 2019-09-19 DIAGNOSIS — R0609 Other forms of dyspnea: Secondary | ICD-10-CM | POA: Diagnosis not present

## 2019-09-19 LAB — BASIC METABOLIC PANEL
BUN/Creatinine Ratio: 18 (ref 9–20)
BUN: 14 mg/dL (ref 6–24)
CO2: 21 mmol/L (ref 20–29)
Calcium: 9.4 mg/dL (ref 8.7–10.2)
Chloride: 102 mmol/L (ref 96–106)
Creatinine, Ser: 0.8 mg/dL (ref 0.76–1.27)
GFR calc Af Amer: 120 mL/min/{1.73_m2} (ref >59)
GFR calc non Af Amer: 103 mL/min/{1.73_m2} (ref >59)
Glucose: 144 mg/dL — ABNORMAL HIGH (ref 65–99)
Potassium: 4.5 mmol/L (ref 3.5–5.2)
Sodium: 139 mmol/L (ref 134–144)

## 2019-09-21 ENCOUNTER — Telehealth: Payer: Self-pay | Admitting: Cardiology

## 2019-09-21 ENCOUNTER — Telehealth (HOSPITAL_COMMUNITY): Payer: Self-pay | Admitting: Emergency Medicine

## 2019-09-21 NOTE — Telephone Encounter (Signed)
Wife of patient called and wanted to confirm pre-procedure instructions. The patient is having a CT at 7:30 tomorrow, and just wants to know where to go and what to do before the test.

## 2019-09-21 NOTE — Telephone Encounter (Signed)
Left message on voicemail with name and callback number Eddye Broxterman RN Navigator Cardiac Imaging [facility] [phone] Office [phone] Cell  

## 2019-09-21 NOTE — Telephone Encounter (Signed)
Spoke with pt wife and reviewed 02/13/2019 cardiac CT AVS instructions with her. She verbalized understanding

## 2019-09-21 NOTE — Telephone Encounter (Signed)
Pt returning phone call regarding upcoming cardiac imaging study; pt verbalizes understanding of appt date/time, parking situation and where to check in, pre-test NPO status and medications ordered, and verified current allergies; name and call back number provided for further questions should they arise Tearia Gibbs RN Navigator Cardiac Imaging [facility] [phone] office [phone] cell   

## 2019-09-22 ENCOUNTER — Ambulatory Visit (HOSPITAL_COMMUNITY)
Admission: RE | Admit: 2019-09-22 | Discharge: 2019-09-22 | Disposition: A | Discharge status: Home / Self Care | Payer: BC Managed Care – PPO | Source: Ambulatory Visit | Attending: Cardiology | Admitting: Cardiology

## 2019-09-22 ENCOUNTER — Other Ambulatory Visit: Payer: Self-pay

## 2019-09-22 ENCOUNTER — Encounter (HOSPITAL_COMMUNITY): Payer: Self-pay

## 2019-09-22 DIAGNOSIS — E119 Type 2 diabetes mellitus without complications: Secondary | ICD-10-CM | POA: Diagnosis not present

## 2019-09-22 DIAGNOSIS — R0600 Dyspnea, unspecified: Secondary | ICD-10-CM | POA: Diagnosis not present

## 2019-09-22 DIAGNOSIS — Z8249 Family history of ischemic heart disease and other diseases of the circulatory system: Secondary | ICD-10-CM | POA: Diagnosis not present

## 2019-09-22 DIAGNOSIS — E8881 Metabolic syndrome: Secondary | ICD-10-CM

## 2019-09-22 DIAGNOSIS — R0609 Other forms of dyspnea: Secondary | ICD-10-CM

## 2019-09-22 MED ORDER — IOHEXOL 350 MG/ML SOLN
80.0000 mL | Freq: Once | INTRAVENOUS | Status: AC | PRN
  Administered 2019-09-22: 80 mL via INTRAVENOUS

## 2019-09-22 MED ORDER — NITROGLYCERIN 0.4 MG SL SUBL
0.8000 mg | SUBLINGUAL_TABLET | Freq: Once | SUBLINGUAL | Status: AC
  Administered 2019-09-22: 0.8 mg via SUBLINGUAL

## 2019-09-22 MED ORDER — NITROGLYCERIN 0.4 MG SL SUBL
SUBLINGUAL_TABLET | SUBLINGUAL | Status: AC
  Filled 2019-09-22: qty 2

## 2019-09-25 ENCOUNTER — Telehealth: Payer: Self-pay

## 2019-09-25 NOTE — Telephone Encounter (Signed)
LMTCB  ________________________  Notes recorded by Leonie Man, MD on 09/23/2019 at 6:07 PM EDT  Great news on the coronary calcium score and coronary artery CT angiogram   Coronary calcium score is 0 with no evidence of coronary artery disease.   Both low risk and completely excludes ischemic coronary artery disease for chest pain or shortness of breath.   Glenetta Hew, MD

## 2019-09-25 NOTE — Telephone Encounter (Signed)
Patient returned call. Aware of results

## 2019-09-25 NOTE — Telephone Encounter (Signed)
Call came directly to DOD pod. The patient's wife is on the phone calling for CT results.  Informed her Dr. Allison Quarry nurse will call with results.

## 2019-10-30 DIAGNOSIS — E1169 Type 2 diabetes mellitus with other specified complication: Secondary | ICD-10-CM | POA: Diagnosis not present

## 2019-10-30 DIAGNOSIS — I10 Essential (primary) hypertension: Secondary | ICD-10-CM | POA: Diagnosis not present

## 2020-01-26 DIAGNOSIS — E1169 Type 2 diabetes mellitus with other specified complication: Secondary | ICD-10-CM | POA: Diagnosis not present

## 2020-01-26 DIAGNOSIS — E7849 Other hyperlipidemia: Secondary | ICD-10-CM | POA: Diagnosis not present

## 2020-01-26 DIAGNOSIS — Z0000 Encounter for general adult medical examination without abnormal findings: Secondary | ICD-10-CM | POA: Diagnosis not present

## 2020-01-26 DIAGNOSIS — Z125 Encounter for screening for malignant neoplasm of prostate: Secondary | ICD-10-CM | POA: Diagnosis not present

## 2020-02-02 DIAGNOSIS — Z0000 Encounter for general adult medical examination without abnormal findings: Secondary | ICD-10-CM | POA: Diagnosis not present

## 2020-02-02 DIAGNOSIS — Z1331 Encounter for screening for depression: Secondary | ICD-10-CM | POA: Diagnosis not present

## 2020-02-02 DIAGNOSIS — G4700 Insomnia, unspecified: Secondary | ICD-10-CM | POA: Diagnosis not present

## 2020-02-02 DIAGNOSIS — E1169 Type 2 diabetes mellitus with other specified complication: Secondary | ICD-10-CM | POA: Diagnosis not present

## 2020-02-02 DIAGNOSIS — I10 Essential (primary) hypertension: Secondary | ICD-10-CM | POA: Diagnosis not present

## 2020-02-02 DIAGNOSIS — I25119 Atherosclerotic heart disease of native coronary artery with unspecified angina pectoris: Secondary | ICD-10-CM | POA: Diagnosis not present

## 2020-03-20 DIAGNOSIS — Z03818 Encounter for observation for suspected exposure to other biological agents ruled out: Secondary | ICD-10-CM | POA: Diagnosis not present

## 2020-03-20 DIAGNOSIS — Z20828 Contact with and (suspected) exposure to other viral communicable diseases: Secondary | ICD-10-CM | POA: Diagnosis not present

## 2020-03-28 DIAGNOSIS — Z23 Encounter for immunization: Secondary | ICD-10-CM | POA: Diagnosis not present

## 2020-05-01 DIAGNOSIS — Z23 Encounter for immunization: Secondary | ICD-10-CM | POA: Diagnosis not present

## 2020-08-20 DIAGNOSIS — H5213 Myopia, bilateral: Secondary | ICD-10-CM | POA: Diagnosis not present

## 2020-08-20 DIAGNOSIS — E119 Type 2 diabetes mellitus without complications: Secondary | ICD-10-CM | POA: Diagnosis not present

## 2020-08-20 DIAGNOSIS — H52223 Regular astigmatism, bilateral: Secondary | ICD-10-CM | POA: Diagnosis not present

## 2020-08-20 DIAGNOSIS — H524 Presbyopia: Secondary | ICD-10-CM | POA: Diagnosis not present

## 2020-12-26 DIAGNOSIS — Z23 Encounter for immunization: Secondary | ICD-10-CM | POA: Diagnosis not present

## 2021-02-05 DIAGNOSIS — E785 Hyperlipidemia, unspecified: Secondary | ICD-10-CM | POA: Diagnosis not present

## 2021-02-05 DIAGNOSIS — Z0000 Encounter for general adult medical examination without abnormal findings: Secondary | ICD-10-CM | POA: Diagnosis not present

## 2021-02-05 DIAGNOSIS — Z125 Encounter for screening for malignant neoplasm of prostate: Secondary | ICD-10-CM | POA: Diagnosis not present

## 2021-02-05 DIAGNOSIS — E1169 Type 2 diabetes mellitus with other specified complication: Secondary | ICD-10-CM | POA: Diagnosis not present

## 2021-02-12 DIAGNOSIS — Z0000 Encounter for general adult medical examination without abnormal findings: Secondary | ICD-10-CM | POA: Diagnosis not present

## 2021-02-12 DIAGNOSIS — E1169 Type 2 diabetes mellitus with other specified complication: Secondary | ICD-10-CM | POA: Diagnosis not present

## 2021-02-12 DIAGNOSIS — R82998 Other abnormal findings in urine: Secondary | ICD-10-CM | POA: Diagnosis not present

## 2021-02-12 DIAGNOSIS — I10 Essential (primary) hypertension: Secondary | ICD-10-CM | POA: Diagnosis not present

## 2021-05-21 DIAGNOSIS — E1169 Type 2 diabetes mellitus with other specified complication: Secondary | ICD-10-CM | POA: Diagnosis not present

## 2021-05-21 DIAGNOSIS — I10 Essential (primary) hypertension: Secondary | ICD-10-CM | POA: Diagnosis not present

## 2022-11-11 ENCOUNTER — Emergency Department (HOSPITAL_BASED_OUTPATIENT_CLINIC_OR_DEPARTMENT_OTHER)
Admission: EM | Admit: 2022-11-11 | Discharge: 2022-11-11 | Disposition: A | Discharge status: Home / Self Care | Payer: BC Managed Care – PPO | Attending: Emergency Medicine | Admitting: Emergency Medicine

## 2022-11-11 ENCOUNTER — Encounter (HOSPITAL_BASED_OUTPATIENT_CLINIC_OR_DEPARTMENT_OTHER): Payer: Self-pay | Admitting: Emergency Medicine

## 2022-11-11 ENCOUNTER — Other Ambulatory Visit: Payer: Self-pay

## 2022-11-11 DIAGNOSIS — Z7982 Long term (current) use of aspirin: Secondary | ICD-10-CM | POA: Insufficient documentation

## 2022-11-11 DIAGNOSIS — I10 Essential (primary) hypertension: Secondary | ICD-10-CM | POA: Insufficient documentation

## 2022-11-11 DIAGNOSIS — W208XXA Other cause of strike by thrown, projected or falling object, initial encounter: Secondary | ICD-10-CM | POA: Insufficient documentation

## 2022-11-11 DIAGNOSIS — Y990 Civilian activity done for income or pay: Secondary | ICD-10-CM | POA: Insufficient documentation

## 2022-11-11 DIAGNOSIS — S0012XA Contusion of left eyelid and periocular area, initial encounter: Secondary | ICD-10-CM | POA: Insufficient documentation

## 2022-11-11 DIAGNOSIS — S0592XA Unspecified injury of left eye and orbit, initial encounter: Secondary | ICD-10-CM

## 2022-11-11 DIAGNOSIS — Z79899 Other long term (current) drug therapy: Secondary | ICD-10-CM | POA: Insufficient documentation

## 2022-11-11 MED ORDER — FLUORESCEIN SODIUM 1 MG OP STRP
1.0000 | ORAL_STRIP | Freq: Once | OPHTHALMIC | Status: AC
  Administered 2022-11-11: 1 via OPHTHALMIC
  Filled 2022-11-11: qty 1

## 2022-11-11 MED ORDER — TETRACAINE HCL 0.5 % OP SOLN
2.0000 [drp] | Freq: Once | OPHTHALMIC | Status: AC
  Administered 2022-11-11: 2 [drp] via OPHTHALMIC
  Filled 2022-11-11: qty 4

## 2022-11-11 NOTE — ED Provider Notes (Signed)
[facility] EMERGENCY DEPARTMENT Provider Note   CSN: [id number] Arrival date & time: 11/11/22  1527     History  Chief Complaint  Patient presents with   Foreign Body in Eye    LEFT    Brian Odonnell is a 56 y.o. male with past medical history significant for hypertension here for evaluation of left eye injury.  He was at work when he an metal object popped off and hit him to the superior aspect of his eyelid.  He has had some bruising.  Initially had some floaters in his left periphery of his vision however this resolved.  He has no current pain.  He denies LOC or anticoagulation.  Wears corrective lenses.  No bleeding, drainage from eye.  No pain with eye movement.  No headache, numbness or weakness. Does not think there is small shards of object in eye.  HPI     Home Medications Prior to Admission medications   Medication Sig Start Date End Date Taking? Authorizing Provider  aspirin EC 81 MG tablet Take 81 mg by mouth daily.    [provider]  losartan (COZAAR) 100 MG tablet Take 100 mg by mouth daily.    [provider]  metformin (FORTAMET) 500 MG (OSM) 24 hr tablet Take 1,000 mg by mouth 2 (two) times daily with a meal.    [provider]  metoprolol tartrate (LOPRESSOR) 50 MG tablet Take 2 tablets (100 mg total) by mouth once for 1 dose. TAKE TWO HOUR PRIOR TO  SCHEDULE CARDAIC TEST 02/13/19 02/13/19  Marykay Lex, MD  traZODone (DESYREL) 150 MG tablet Take 150 mg by mouth at bedtime.    [provider]      Allergies    Patient has no known allergies.    Review of Systems   Review of Systems  Constitutional: Negative.   HENT: Negative.    Eyes: Negative.   Respiratory: Negative.    Cardiovascular: Negative.   Gastrointestinal: Negative.   Genitourinary: Negative.   Musculoskeletal: Negative.   Skin: Negative.   Neurological: Negative.   All other systems reviewed and are negative.   Physical Exam Updated Vital  Signs BP (!) 162/90 (BP Location: Left Arm)   Pulse 89   Temp 97.8 F (36.6 C)   Resp 18   Ht 5\' 11"  (1.803 m)   Wt 108.9 kg   SpO2 98%   BMI 33.47 kg/m  Physical Exam Vitals and nursing note reviewed.  Constitutional:      General: He is not in acute distress.    Appearance: He is well-developed. He is not ill-appearing, toxic-appearing or diaphoretic.  HENT:     Head: Atraumatic.     Jaw: There is normal jaw occlusion.  Eyes:     General: Lids are everted, no foreign bodies appreciated. No allergic shiner, visual field deficit or scleral icterus.       Right eye: No foreign body, discharge or hordeolum.        Left eye: No foreign body, discharge or hordeolum.     Extraocular Movements: Extraocular movements intact.     Conjunctiva/sclera: Conjunctivae normal.     Pupils: Pupils are equal, round, and reactive to light.     Right eye: No corneal abrasion or fluorescein uptake. Seidel exam negative.     Left eye: No corneal abrasion or fluorescein uptake. Seidel exam negative.    Slit lamp exam:    Right eye: Anterior chamber quiet.  Left eye: Anterior chamber quiet.     Visual Fields: Right eye visual fields normal and left eye visual fields normal.      Comments: PERRLA, EOM intact. No fluorescein uptake BL, negative Seidel sign.  Mild ecchymosis superior aspect left eyelid.  No racoon eyes.  No obvious retained foreign object to sclera, conjunctiva.  Cardiovascular:     Rate and Rhythm: Normal rate and regular rhythm.  Pulmonary:     Effort: Pulmonary effort is normal. No respiratory distress.  Abdominal:     General: There is no distension.     Palpations: Abdomen is soft.  Musculoskeletal:        General: Normal range of motion.     Cervical back: Normal range of motion and neck supple.  Skin:    General: Skin is warm and dry.     Capillary Refill: Capillary refill takes less than 2 seconds.     Findings: Bruising present.     Comments: Mild ecchymosis to left  superior eye lid. No lacerations, open skin. No circumferential ecchymosis, racoon eyes. No erythema, warmth.  Neurological:     General: No focal deficit present.     Mental Status: He is alert and oriented to person, place, and time.    ED Results / Procedures / Treatments   Labs (all labs ordered are listed, but only abnormal results are displayed) Labs Reviewed - No data to display  EKG None  Radiology No results found. EMERGENCY DEPARTMENT [place] OCULAR EXAM "Study: Limited Ultrasound of Orbit "  INDICATIONS: Traumatic Linear probe utilized to obtain images in both long and short axis of the orbit having the patient look left and right if possible.  PERFORMED BY: Myself IMAGES ARCHIVED?: Yes LIMITATIONS: none VIEWS USED: Left orbit INTERPRETATION: No retinal detachment, Lens in proper position  Procedures    Medications Ordered in ED Medications  tetracaine (PONTOCAINE) 0.5 % ophthalmic solution 2 drop (2 drops Left Eye Given 11/11/22 1541)  fluorescein ophthalmic strip 1 strip (1 strip Left Eye Given 11/11/22 1541)    ED Course/ Medical Decision Making/ A&P    56 year old here for evaluation of possible eye injury.  Was at work when a metal object popped off hit to the superior aspect of his eyelid.  Initially had some floaters in the periphery of his left vision which have since resolved.  He has no eye pain.  No drainage.  Does have some minimal ecchymosis to superior eyelid however no raccoon eyes. No lacerations to suture. He denies any LOC or anticoagulation.  He has a nonfocal neuroexam without deficits.  Fluorescein uptake negative BIL.  No ulcer, corneal abrasion. Negative Seidel sign.  PERRLA.  Quiet anterior/posterior chamber.  No obvious retained foreign object. Low suspicion for globe rupture, retained foreign object. Patient denies possible possibility small retained foreign object such as metal shard. Do not feel need for CT imaging at this time. Bedside  ultrasound performed by myself negative for retinal detachment, lens was in appropriate position.  No subconjunctival hemorrhage. Low suspicion for retrobulbar hematoma, ocular entrapment. Vision equal BIL. No erythema, warmth to suggest periorbital/ orbital cellulitis. Discussed symptomatic management, close follow-up with ophthalmology, return if he develops any blurred vision, visual field cuts, drainage from eye or develops pain.  The patient has been appropriately medically screened and/or stabilized in the ED. I have low suspicion for any other emergent medical condition which would require further screening, evaluation or treatment in the ED or require inpatient management.  Patient  is hemodynamically stable and in no acute distress.  Patient able to ambulate in department prior to ED.  Evaluation does not show acute pathology that would require ongoing or additional emergent interventions while in the emergency department or further inpatient treatment.  I have discussed the diagnosis with the patient and answered all questions.  Pain is been managed while in the emergency department and patient has no further complaints prior to discharge.  Patient is comfortable with plan discussed in room and is stable for discharge at this time.  I have discussed strict return precautions for returning to the emergency department.  Patient was encouraged to follow-up with PCP/specialist refer to at discharge.                            Medical Decision Making Amount and/or Complexity of Data Reviewed External Data Reviewed: labs, radiology and notes. Radiology: ordered and independent interpretation performed. Decision-making details documented in ED Course.  Risk OTC drugs. Prescription drug management. Decision regarding hospitalization. Diagnosis or treatment significantly limited by social determinants of health.          Final Clinical Impression(s) / ED Diagnoses Final diagnoses:  Left  eye injury, initial encounter    Rx / DC Orders ED Discharge Orders     None         Lekeisha Arenas A, PA-C 11/11/22 1608    Vanetta Mulders, MD 11/19/22 1519

## 2022-11-11 NOTE — ED Notes (Signed)
ED Provider at bedside. 

## 2022-11-11 NOTE — Discharge Instructions (Addendum)
Your exam today was reassuring in the emergency department.  Make sure to place ice to your eyelid to decrease swelling.  May also get over-the-counter moisturizing eyedrops.  If you have any significant changes such as severe worsening of your eyelid, drainage, bleeding, vision changes please seek immediate reevaluation.

## 2022-11-11 NOTE — ED Triage Notes (Signed)
Reports possible metal in left eye x 1 hr. Endorses changes in vision in peripheral left eye.

## 2022-11-11 NOTE — ED Notes (Signed)
Pt verbalized understanding with D/C orders.
